# Patient Record
Sex: Female | Born: 2000 | Race: Black or African American | Hispanic: No | Marital: Single | Smoking: Current every day smoker
Health system: Southern US, Community
[De-identification: ages and names within clinical notes are randomized; demographics above are authoritative.]

## PROBLEM LIST (undated history)

## (undated) DIAGNOSIS — T7840XA Allergy, unspecified, initial encounter: Secondary | ICD-10-CM

## (undated) DIAGNOSIS — J45909 Unspecified asthma, uncomplicated: Secondary | ICD-10-CM

## (undated) DIAGNOSIS — D649 Anemia, unspecified: Secondary | ICD-10-CM

## (undated) DIAGNOSIS — J302 Other seasonal allergic rhinitis: Secondary | ICD-10-CM

## (undated) HISTORY — DX: Allergy, unspecified, initial encounter: T78.40XA

## (undated) HISTORY — PX: NO PAST SURGERIES: SHX2092

## (undated) HISTORY — DX: Unspecified asthma, uncomplicated: J45.909

## (undated) HISTORY — DX: Anemia, unspecified: D64.9

---
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
## 2022-02-13 DIAGNOSIS — Z124 Encounter for screening for malignant neoplasm of cervix: Secondary | ICD-10-CM | POA: Diagnosis not present

## 2022-02-13 DIAGNOSIS — Z6824 Body mass index (BMI) 24.0-24.9, adult: Secondary | ICD-10-CM | POA: Diagnosis not present

## 2022-02-13 DIAGNOSIS — Z01419 Encounter for gynecological examination (general) (routine) without abnormal findings: Secondary | ICD-10-CM | POA: Diagnosis not present

## 2022-02-13 DIAGNOSIS — R69 Illness, unspecified: Secondary | ICD-10-CM | POA: Diagnosis not present

## 2022-03-11 DIAGNOSIS — Z3046 Encounter for surveillance of implantable subdermal contraceptive: Secondary | ICD-10-CM | POA: Diagnosis not present

## 2022-03-18 DIAGNOSIS — Z3046 Encounter for surveillance of implantable subdermal contraceptive: Secondary | ICD-10-CM | POA: Diagnosis not present

## 2023-03-26 ENCOUNTER — Ambulatory Visit: Payer: Medicaid Other | Admitting: Family Medicine

## 2023-11-17 ENCOUNTER — Encounter: Payer: Self-pay | Admitting: Obstetrics and Gynecology

## 2023-11-17 ENCOUNTER — Ambulatory Visit (INDEPENDENT_AMBULATORY_CARE_PROVIDER_SITE_OTHER): Admitting: Obstetrics and Gynecology

## 2023-11-17 ENCOUNTER — Other Ambulatory Visit (HOSPITAL_COMMUNITY)
Admission: RE | Admit: 2023-11-17 | Discharge: 2023-11-17 | Disposition: A | Discharge status: Home / Self Care | Source: Ambulatory Visit | Attending: Obstetrics and Gynecology | Admitting: Obstetrics and Gynecology

## 2023-11-17 VITALS — BP 132/84 | HR 77 | Ht 60.0 in | Wt 165.0 lb

## 2023-11-17 DIAGNOSIS — Z01419 Encounter for gynecological examination (general) (routine) without abnormal findings: Secondary | ICD-10-CM | POA: Diagnosis present

## 2023-11-17 DIAGNOSIS — Z113 Encounter for screening for infections with a predominantly sexual mode of transmission: Secondary | ICD-10-CM | POA: Insufficient documentation

## 2023-11-17 DIAGNOSIS — Z3046 Encounter for surveillance of implantable subdermal contraceptive: Secondary | ICD-10-CM

## 2023-11-17 NOTE — Progress Notes (Signed)
 GYNECOLOGY ANNUAL PREVENTATIVE CARE ENCOUNTER NOTE  History:     Tammie Cameron is a 23 y.o. G0P0000 female here for a routine annual gynecologic exam.  Current complaints: increased discharge per pt.  Previous self swabs have been negative.   Denies abnormal vaginal bleeding,  pelvic pain, problems with intercourse or other gynecologic concerns.    Gynecologic History No LMP recorded. Patient has had an implant. Contraception: abstinence Last Pap: no records in system.  Last mammogram: n/a.  Obstetric History OB History  Gravida Para Term Preterm AB Living  0 0 0 0 0 0  SAB IAB Ectopic Multiple Live Births  0 0 0 0 0    Past Medical History:  Diagnosis Date   Allergy    Anemia    Asthma    Seasonal allergies     Past Surgical History:  Procedure Laterality Date   NO PAST SURGERIES      Current Outpatient Medications on File Prior to Visit  Medication Sig Dispense Refill   albuterol (PROVENTIL HFA;VENTOLIN HFA) 108 (90 BASE) MCG/ACT inhaler Inhale 2 puffs into the lungs every 6 (six) hours as needed. For wheeze (Patient not taking: Reported on 11/17/2023)     naproxen sodium (ANAPROX) 220 MG tablet Take 220 mg by mouth daily as needed. For pain     No current facility-administered medications on file prior to visit.    No Known Allergies  Social History:  reports that she has quit smoking. Her smoking use included cigars. She has never used smokeless tobacco. She reports that she does not drink alcohol and does not use drugs.  Family History  Problem Relation Age of Onset   Birth defects Sister    Diabetes Maternal Grandmother    Cancer Maternal Grandfather    Depression Neg Hx     The following portions of the patient's history were reviewed and updated as appropriate: allergies, current medications, past family history, past medical history, past social history, past surgical history and problem list.  Review of Systems Pertinent items noted in HPI and  remainder of comprehensive ROS otherwise negative.  Physical Exam:  BP 132/84   Pulse 77   Ht 5' (1.524 m)   Wt 165 lb (74.8 kg)   BMI 32.22 kg/m  CONSTITUTIONAL: Well-developed, well-nourished female in no acute distress.  HENT:  Normocephalic, atraumatic, External right and left ear normal. Oropharynx is clear and moist EYES: Conjunctivae and EOM are normal.  NECK: Normal range of motion, supple, no masses.  Normal thyroid .  SKIN: Skin is warm and dry. No rash noted. Not diaphoretic. No erythema. No pallor. MUSCULOSKELETAL: Normal range of motion. No tenderness.  No cyanosis, clubbing, or edema.  2+ distal pulses. NEUROLOGIC: Alert and oriented to person, place, and time. Normal reflexes, muscle tone coordination.  PSYCHIATRIC: Normal mood and affect. Normal behavior. Normal judgment and thought content. CARDIOVASCULAR: Normal heart rate noted, regular rhythm RESPIRATORY: Clear to auscultation bilaterally. Effort and breath sounds normal, no problems with respiration noted. BREASTS: Symmetric in size. No masses, tenderness, skin changes, nipple drainage, or lymphadenopathy bilaterally. Performed in the presence of a chaperone. ABDOMEN: Soft, no distention noted.  No tenderness, rebound or guarding.  PELVIC: Normal appearing external genitalia and urethral meatus; normal appearing vaginal mucosa and cervix.  No abnormal discharge noted.  Pap smear obtained. Vaginal swab obtained. Normal uterine size, no other palpable masses, no uterine or adnexal tenderness.  Performed in the presence of a chaperone.   Assessment and Plan:  1. Women's annual routine gynecological examination (Primary) Normal annual exam - Cytology - PAP( [place])  2. Routine screening for STI (sexually transmitted infection) Per pt request No abnormal vaginal discharge noted - Cervicovaginal ancillary only( [place]) - HIV antibody (with reflex) - Hepatitis C Antibody - Hepatitis B Surface AntiGEN -  RPR  3. Nexplanon removal See separate note  Will follow up results of pap smear and manage accordingly. Routine preventative health maintenance measures emphasized. Please refer to After Visit Summary for other counseling recommendations.      Jerilynn Buddle, MD, FACOG Obstetrician & Gynecologist, [facility] for [facility], [facility]

## 2023-11-17 NOTE — Progress Notes (Signed)
     GYNECOLOGY OFFICE PROCEDURE NOTE  Tammie Cameron is a 23 y.o. G0P0000 here for Nexplanon removal.    No other gynecologic concerns.   Nexplanon Removal Patient identified, informed consent performed, consent signed.   Appropriate time out taken. Nexplanon site identified.  Area prepped in usual sterile fashon. One ml of 1% lidocaine was used to anesthetize the area at the distal end of the implant. A small stab incision was made right beside the implant on the distal portion.  The Nexplanon rod was grasped using hemostats and removed without difficulty.  There was minimal blood loss. There were no complications.  3 ml of 1% lidocaine was injected around the incision for post-procedure analgesia.  Steri-strips were applied over the small incision.  A pressure bandage was applied to reduce any bruising.  The patient tolerated the procedure well and was given post procedure instructions.  Patient is planning to use abstinence for contraception.  Also patient is currently incarcerated.    Jerilynn Buddle, MD, FACOG Obstetrician & Gynecologist, [facility] for [facility], [facility]

## 2023-11-17 NOTE — Progress Notes (Signed)
 23 y.o. New GYN presents for AEX/PAP/STD screening.  Pt wants to have her Nexplanon removed 9 (inserted 2023).

## 2023-11-18 LAB — CYTOLOGY - PAP: Diagnosis: NEGATIVE

## 2023-11-18 LAB — CERVICOVAGINAL ANCILLARY ONLY
Bacterial Vaginitis (gardnerella): NEGATIVE
Candida Glabrata: NEGATIVE
Candida Vaginitis: NEGATIVE
Chlamydia: NEGATIVE
Comment: NEGATIVE
Comment: NEGATIVE
Comment: NEGATIVE
Comment: NEGATIVE
Comment: NEGATIVE
Comment: NORMAL
Neisseria Gonorrhea: NEGATIVE
Trichomonas: NEGATIVE

## 2023-11-19 ENCOUNTER — Ambulatory Visit: Payer: Self-pay | Admitting: Obstetrics and Gynecology

## 2023-11-20 LAB — RPR: RPR Ser Ql: NONREACTIVE

## 2023-11-20 LAB — HEPATITIS C ANTIBODY: Hep C Virus Ab: NONREACTIVE

## 2023-11-20 LAB — HIV ANTIBODY (ROUTINE TESTING W REFLEX): HIV Screen 4th Generation wRfx: NONREACTIVE

## 2023-11-20 LAB — HEPATITIS B SURFACE ANTIGEN: Hepatitis B Surface Ag: NEGATIVE
